# Patient Record
Sex: Male | Born: 1970 | Race: Black or African American | Hispanic: No | Marital: Married | Smoking: Never smoker
Health system: Southern US, Community
[De-identification: ages and names within clinical notes are randomized; demographics above are authoritative.]

## PROBLEM LIST (undated history)

## (undated) HISTORY — PX: HERNIA REPAIR: SHX51

## (undated) HISTORY — PX: KNEE SURGERY: SHX244

---

## 2012-03-26 ENCOUNTER — Emergency Department (HOSPITAL_COMMUNITY)

## 2012-03-26 ENCOUNTER — Encounter (HOSPITAL_COMMUNITY): Payer: Self-pay

## 2012-03-26 ENCOUNTER — Emergency Department (HOSPITAL_COMMUNITY)
Admission: EM | Admit: 2012-03-26 | Discharge: 2012-03-26 | Disposition: A | Discharge status: Home / Self Care | Attending: Emergency Medicine | Admitting: Emergency Medicine

## 2012-03-26 DIAGNOSIS — I959 Hypotension, unspecified: Secondary | ICD-10-CM | POA: Insufficient documentation

## 2012-03-26 DIAGNOSIS — R059 Cough, unspecified: Secondary | ICD-10-CM | POA: Insufficient documentation

## 2012-03-26 DIAGNOSIS — R55 Syncope and collapse: Secondary | ICD-10-CM | POA: Insufficient documentation

## 2012-03-26 DIAGNOSIS — R05 Cough: Secondary | ICD-10-CM | POA: Insufficient documentation

## 2012-03-26 DIAGNOSIS — E860 Dehydration: Secondary | ICD-10-CM

## 2012-03-26 LAB — BASIC METABOLIC PANEL
BUN: 9 mg/dL (ref 6–23)
Chloride: 107 mEq/L (ref 96–112)
Creatinine, Ser: 1.49 mg/dL — ABNORMAL HIGH (ref 0.50–1.35)
GFR calc Af Amer: 66 mL/min — ABNORMAL LOW (ref >90)

## 2012-03-26 LAB — URINALYSIS, ROUTINE W REFLEX MICROSCOPIC
Glucose, UA: NEGATIVE mg/dL
Leukocytes, UA: NEGATIVE
Specific Gravity, Urine: 1.025 (ref 1.005–1.030)
pH: 6 (ref 5.0–8.0)

## 2012-03-26 LAB — D-DIMER, QUANTITATIVE: D-Dimer, Quant: <0.22 ug/mL-FEU (ref 0.00–0.48)

## 2012-03-26 LAB — CBC
HCT: 39.1 % (ref 39.0–52.0)
Hemoglobin: 12.9 g/dL — ABNORMAL LOW (ref 13.0–17.0)
MCH: 28.9 pg (ref 26.0–34.0)
MCHC: 33 g/dL (ref 30.0–36.0)
MCV: 87.5 fL (ref 78.0–100.0)

## 2012-03-26 LAB — DIFFERENTIAL
Basophils Relative: 0 % (ref 0–1)
Monocytes Absolute: 0.4 10*3/uL (ref 0.1–1.0)
Monocytes Relative: 4 % (ref 3–12)
Neutro Abs: 7.5 10*3/uL (ref 1.7–7.7)

## 2012-03-26 LAB — RAPID URINE DRUG SCREEN, HOSP PERFORMED
Amphetamines: NOT DETECTED
Barbiturates: NOT DETECTED
Benzodiazepines: NOT DETECTED

## 2012-03-26 LAB — GLUCOSE, CAPILLARY: Glucose-Capillary: 90 mg/dL (ref 70–99)

## 2012-03-26 MED ORDER — SODIUM CHLORIDE 0.9 % IV BOLUS (SEPSIS)
1000.0000 mL | Freq: Once | INTRAVENOUS | Status: AC
  Administered 2012-03-26: 1000 mL via INTRAVENOUS

## 2012-03-26 MED ORDER — SODIUM CHLORIDE 0.9 % IV SOLN: INTRAVENOUS | Status: DC

## 2012-03-26 NOTE — ED Notes (Signed)
No complaints of being dizzy or unsteady during orthostatic vital signs

## 2012-03-26 NOTE — ED Notes (Signed)
Pt arrived via ems.  Reports worked out this am and felt very hot and dizzy.  EMS reports pt was hypotensive.  EMS administered approx of NSS.  BP 98/46 at this time.  Pt says feels a little better.  Pt has had breakfast.

## 2012-03-26 NOTE — ED Notes (Signed)
Patient eating food at this time, no other needs voiced at this time.

## 2012-03-26 NOTE — ED Notes (Signed)
Patient ambulated to restroom and back without difficulty.

## 2012-03-26 NOTE — Discharge Instructions (Signed)
RESOURCE GUIDE  Dental Problems  Patients with Medicaid: [facility]                     [facility] [address]                                           [address] Phone:  [phone]                                                  Phone:  [phone]  If unable to pay or uninsured, contact:  [facility] or [facility] to become qualified for the adult dental clinic.  Chronic Pain Problems Contact [facility]  [phone] Patients need to be referred by their primary care doctor.  Insufficient Money for Medicine Contact [employer]:  call "211" or [facility] 271-5999.  No Primary Care Doctor Call [facility]  832-8000 Other agencies that provide inexpensive medical care    [facility]  [phone]    [facility]  [phone]    [facility]  [phone]    [facility] Clinic  [phone]    [facility]  [phone]    [facility]  [phone]  Psychological Services [facility]  [phone] [facility]  [phone] [facility]   [phone] (emergency services [phone])  Substance Abuse Resources [facility]  336-882-2125 [facility] 336-784-9470 [facility] 336-285-9073 [facility] 336-845-3988 Residential & Outpatient Substance Abuse Program  800-659-3381  Abuse/Neglect [place] Child Abuse Hotline (336) 641-3795 [place] Child Abuse Hotline 800-378-5315 (After Hours)  Emergency Shelter [facility] (336) 271-5985  Maternity Homes [facility] (336) 275-9566 [facility] (704) 372-4663  MRSA Hotline #:   832-7006    [place] [facility]  [facility] [place]     [employer]                          [facility] [address] [place]                       [address]      [address]  [place]                                                [place]                            [place] Phone:  [phone]                                   Phone:  [phone]                 Phone:  [phone]  [facility] Phone:  [phone]    [facility] Child Abuse Hotline 937-535-6972 910-675-6594 (After Hours)   Take your usual prescriptions as previously directed.  Call your regular medical doctor today to schedule a follow up appointment within the next 2 days.  Return to the Emergency Department immediately sooner if worsening.

## 2012-03-26 NOTE — ED Notes (Signed)
Staff from correctional facility called and requested that a drug screen be done to determine if pt has any "K2" in his system.  Notified Dr. Clarene Duke.  Spoke with Gunnar Fusi in the lab and was told what to order.  Order written on downtime order form and sent to lab.  Was told would be added on to the urine that they already had and the test is a send out.

## 2012-03-26 NOTE — ED Provider Notes (Signed)
History   This chart was scribed for Laray Anger, DO by Brooks Sailors. The patient was seen in room APA06/APA06.  CSN: [id number]  Arrival date & time 03/26/12  1011   First MD Initiated Contact with Patient 03/26/12 1029      Chief Complaint  Patient presents with  . Hypotension     HPI Pt seen at 1045.  Per pt, EMS and Geophysical data processor, pt c/o sudden onset and resolution of one brief episode of syncope that began PTA.  Patient states he finished working out, then felt hot and dizzy, followed by a syncopal episode.  EMS notes pt's BP was "low" on their arrival to the scene, gave IVF bolus en route with improvement.  Pt states he feels "better" now.  Denies CP/palpitations, no SOB/cough, no abd pain, no back pain, no headache, no neck pain, no visual changes, no focal motor weakness, no tingling/numbness in extremities, no N/V/D, no recent head/neck trauma.     History reviewed. No pertinent past medical history.  Past Surgical History  Procedure Date  . Hernia repair   . Knee surgery     History  Substance Use Topics  . Smoking status: Never Smoker   . Smokeless tobacco: Not on file  . Alcohol Use: No    Review of Systems ROS: Statement: All systems negative except as marked or noted in the HPI; Constitutional: Negative for fever and chills. ; ; Eyes: Negative for eye pain, redness and discharge. ; ; ENMT: Negative for ear pain, hoarseness, nasal congestion, sinus pressure and sore throat. ; ; Cardiovascular: Negative for chest pain, palpitations, diaphoresis, dyspnea and peripheral edema. ; ; Respiratory: Negative for cough, wheezing and stridor. ; ; Gastrointestinal: Negative for nausea, vomiting, diarrhea, abdominal pain, blood in stool, hematemesis, jaundice and rectal bleeding. . ; ; Genitourinary: Negative for dysuria, flank pain and hematuria. ; ; Musculoskeletal: Negative for back pain and neck pain. Negative for swelling and trauma.; ; Skin: Negative for  pruritus, rash, abrasions, blisters, bruising and skin lesion.; ; Neuro: Negative for headache, lightheadedness and neck stiffness. Negative for weakness, altered level of consciousness , altered mental status, extremity weakness, paresthesias, involuntary movement, seizure and +syncope.      Allergies  Review of patient's allergies indicates no known allergies.  Home Medications  No current outpatient prescriptions on file.  BP 101/63  Pulse 70  Temp(Src) 97.9 F (36.6 C) (Oral)  Resp 16  Ht 5\' 9"  (1.753 m)  Wt 175 lb (79.379 kg)  BMI 25.84 kg/m2  SpO2 100%  Physical Exam 1050: Physical examination:  Nursing notes reviewed; Vital signs and O2 SAT reviewed;  Constitutional: Well developed, Well nourished, In no acute distress; Head:  Normocephalic, atraumatic; Eyes: EOMI, PERRL, No scleral icterus; ENMT: Mouth and pharynx normal, Mucous membranes dry; Neck: Supple, Full range of motion, No lymphadenopathy; Cardiovascular: Regular rate and rhythm, No murmur or gallop; Respiratory: Breath sounds clear & equal bilaterally, No rales, rhonchi, wheezes, Normal respiratory effort/excursion; Chest: Nontender, Movement normal; Abdomen: Soft, Nontender, Nondistended, Normal bowel sounds;  Extremities: Pulses normal, No tenderness, No edema, No calf edema or asymmetry.; Neuro: AA&Ox3, Major CN grossly intact. Speech clear, no facial droop. No gross focal motor or sensory deficits in extremities.; Skin: Color normal, Warm, Dry, no rash.    ED Course  Procedures    MDM  MDM Reviewed: nursing note and vitals Interpretation: ECG, labs, x-ray and CT scan      Date: 03/26/2012  Rate: 54  Rhythm: sinus bradycardia  QRS Axis: normal  Intervals: normal  ST/T Wave abnormalities: normal  Conduction Disutrbances:none  Narrative Interpretation:   Old EKG Reviewed: none available.  Results for orders placed during the hospital encounter of 03/26/12  GLUCOSE, CAPILLARY      Component Value  Range   Glucose-Capillary 90  70 - 99 (mg/dL)  CBC      Component Value Range   WBC 9.1  4.0 - 10.5 (K/uL)   RBC 4.47  4.22 - 5.81 (MIL/uL)   Hemoglobin 12.9 (*) 13.0 - 17.0 (g/dL)   HCT 78.2  95.6 - 21.3 (%)   MCV 87.5  78.0 - 100.0 (fL)   MCH 28.9  26.0 - 34.0 (pg)   MCHC 33.0  30.0 - 36.0 (g/dL)   RDW 08.6  57.8 - 46.9 (%)   Platelets 198  150 - 400 (K/uL)  DIFFERENTIAL      Component Value Range   Neutrophils Relative 82 (*) 43 - 77 (%)   Neutro Abs 7.5  1.7 - 7.7 (K/uL)   Lymphocytes Relative 12  12 - 46 (%)   Lymphs Abs 1.1  0.7 - 4.0 (K/uL)   Monocytes Relative 4  3 - 12 (%)   Monocytes Absolute 0.4  0.1 - 1.0 (K/uL)   Eosinophils Relative 1  0 - 5 (%)   Eosinophils Absolute 0.1  0.0 - 0.7 (K/uL)   Basophils Relative 0  0 - 1 (%)   Basophils Absolute 0.0  0.0 - 0.1 (K/uL)  BASIC METABOLIC PANEL      Component Value Range   Sodium 140  135 - 145 (mEq/L)   Potassium 4.5  3.5 - 5.1 (mEq/L)   Chloride 107  96 - 112 (mEq/L)   CO2 27  19 - 32 (mEq/L)   Glucose, Bld 88  70 - 99 (mg/dL)   BUN 9  6 - 23 (mg/dL)   Creatinine, Ser 6.29 (*) 0.50 - 1.35 (mg/dL)   Calcium 9.5  8.4 - 52.8 (mg/dL)   GFR calc non Af Amer 57 (*) >90 (mL/min)   GFR calc Af Amer 66 (*) >90 (mL/min)  TROPONIN I      Component Value Range   Troponin I <0.30  <0.30 (ng/mL)  URINE RAPID DRUG SCREEN (HOSP PERFORMED)      Component Value Range   Opiates NONE DETECTED  NONE DETECTED    Cocaine NONE DETECTED  NONE DETECTED    Benzodiazepines NONE DETECTED  NONE DETECTED    Amphetamines NONE DETECTED  NONE DETECTED    Tetrahydrocannabinol POSITIVE (*) NONE DETECTED    Barbiturates NONE DETECTED  NONE DETECTED   URINALYSIS, ROUTINE W REFLEX MICROSCOPIC      Component Value Range   Color, Urine YELLOW  YELLOW    APPearance CLEAR  CLEAR    Specific Gravity, Urine 1.025  1.005 - 1.030    pH 6.0  5.0 - 8.0    Glucose, UA NEGATIVE  NEGATIVE (mg/dL)   Hgb urine dipstick NEGATIVE  NEGATIVE    Bilirubin  Urine NEGATIVE  NEGATIVE    Ketones, ur NEGATIVE  NEGATIVE (mg/dL)   Protein, ur NEGATIVE  NEGATIVE (mg/dL)   Urobilinogen, UA 0.2  0.0 - 1.0 (mg/dL)   Nitrite NEGATIVE  NEGATIVE    Leukocytes, UA NEGATIVE  NEGATIVE   D-DIMER, QUANTITATIVE      Component Value Range   D-Dimer, Quant <0.22  0.00 - 0.48 (ug/mL-FEU)   Dg Chest 2 View 03/26/2012  *  RADIOLOGY REPORT*  Clinical Data: Cough  CHEST - 2 VIEW  Comparison: None  Findings: Upper-normal size of cardiac silhouette. Mediastinal contours and pulmonary vascularity normal. Lungs appear mildly hyperaerated but clear. No pleural effusion or pneumothorax. Question left nipple shadow versus nodular density lower left chest. No acute osseous findings.  IMPRESSION: Mildly hyperaerated lungs without acute infiltrate. Question left nipple shadow versus nodular density; recommend repeat PA chest radiograph with nipple markers to exclude pulmonary nodule.  Original Report Authenticated By: Lollie Marrow, M.D.   Ct Head Wo Contrast 03/26/2012  *RADIOLOGY REPORT*  Clinical Data: Syncope  CT HEAD WITHOUT CONTRAST  Technique:  Contiguous axial images were obtained from the base of the skull through the vertex without contrast.  Comparison: None  Findings: Normal ventricular morphology. No midline shift or mass effect. Normal appearance of brain parenchyma. No intracranial hemorrhage, mass lesion, or acute infarction. Visualized paranasal sinuses and mastoid air cells clear. Bones unremarkable.  IMPRESSION: No acute intracranial abnormalities.  Original Report Authenticated By: Lollie Marrow, M.D.   Dg Chest Special View 03/26/2012  *RADIOLOGY REPORT*  Clinical Data: Abnormal chest x-ray question nipple shadow versus nodule  CHEST SPECIAL VIEW  Comparison: Repeat PA chest radiograph with nipple markers compared to earlier study of 03/26/2012  Findings: Left nipple marker corresponds to nodular density seen on previous exam compatible with nipple shadow. No pulmonary  nodule identified. Lungs remain clear. Stable heart size.  IMPRESSION: Nipple shadow on previous exam; no evidence of pulmonary nodule.  Original Report Authenticated By: Lollie Marrow, M.D.     3:10 PM:  VS remain stable after IVF and several hours of observation in the ED.  No change in neuro status.  Pt has walked around the ED with steady gait, easy resps, denies lightheadedness/near syncope, denies CP/SOB.  Has tol PO meal in ED well without N/V.  Doubt ACS or PE as cause for syncope.  Jail staff are requesting "a lab test to check for K2," as they are concerned that "pt has been smoking K2."  ED RN to discuss with lab on how to order.  Dx testing d/w pt and Jail staff.  Questions answered.  Verb understanding, agreeable to d/c home with outpt f/u.          Laray Anger, DO 03/28/12 1842

## 2012-03-29 LAB — URINE CULTURE: Colony Count: >=100000

## 2012-04-04 NOTE — ED Notes (Signed)
Copy of urine faxed to [phone]

## 2012-04-12 LAB — MISCELLANEOUS TEST

## 2013-12-30 IMAGING — CT CT HEAD W/O CM
1 series · 16 of 30 positions shown, 20 images · non-contrast
Comparison: None

CLINICAL DATA: Syncope

CT HEAD WITHOUT CONTRAST
TECHNIQUE: Contiguous axial images were obtained from the base of
the skull through the vertex without contrast.

[Series 2: headseq 4.8 h37s · axial · 0.43mm/px · z∈[+213,+366]mm · 16 of 36 slices shown, 20 images]
[im 2/36  brain]
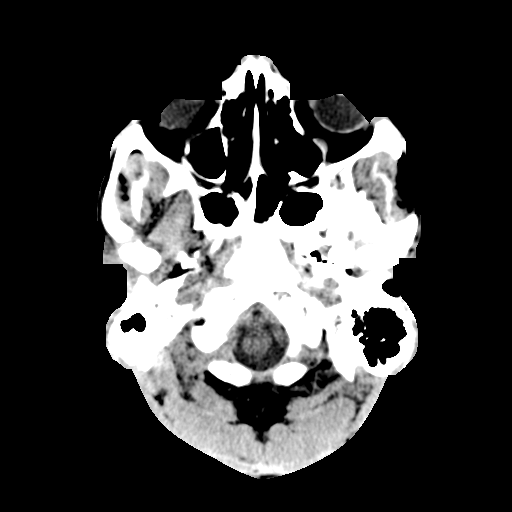
[im 2/36  bone]
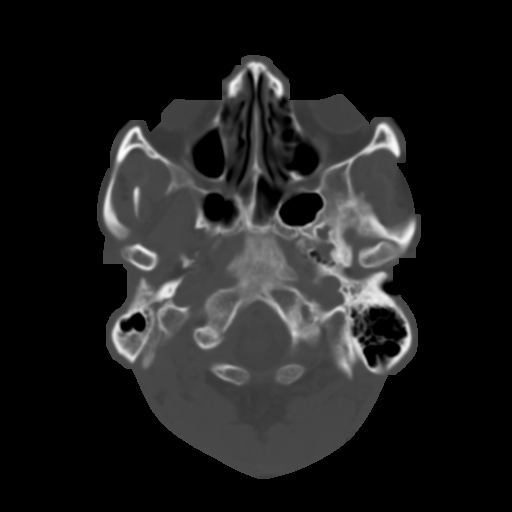
[im 4/36  brain]
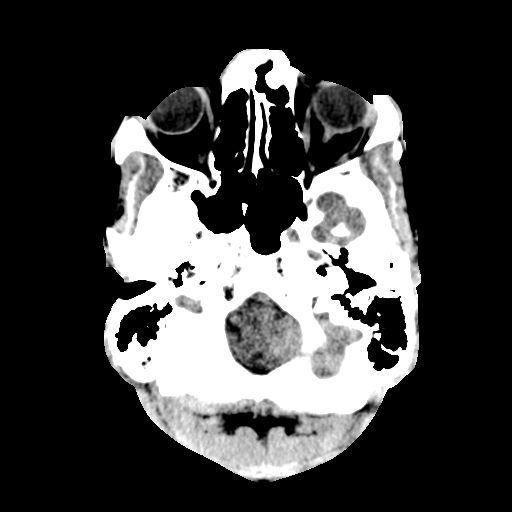
[im 7/36  brain]
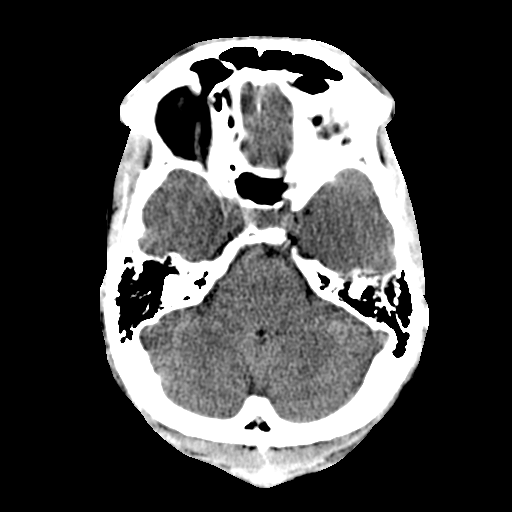
[im 9/36  brain]
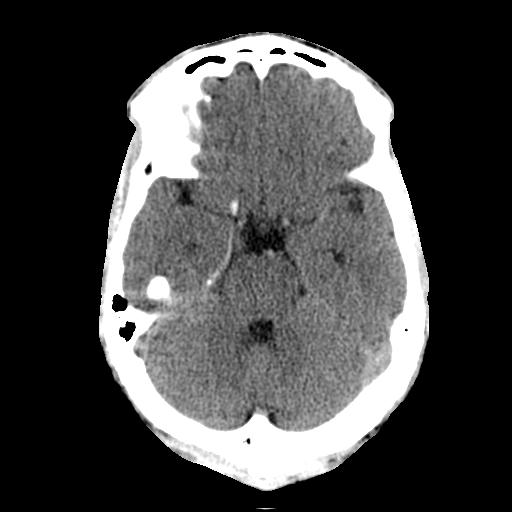
[im 10/36  brain]
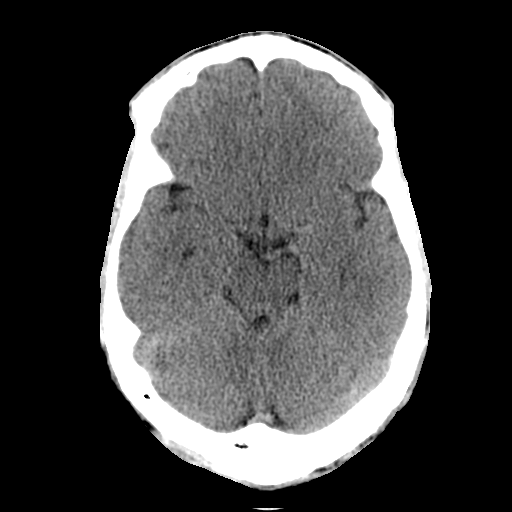
[im 10/36  bone]
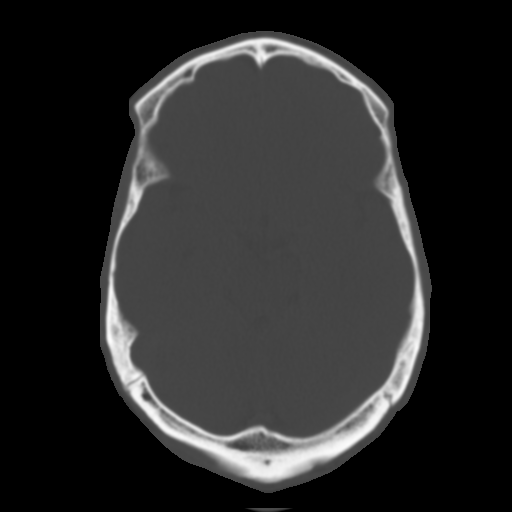
[im 13/36  brain]
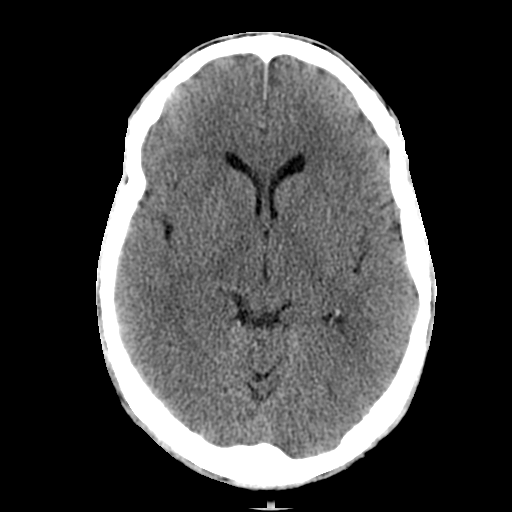
[im 15/36  brain]
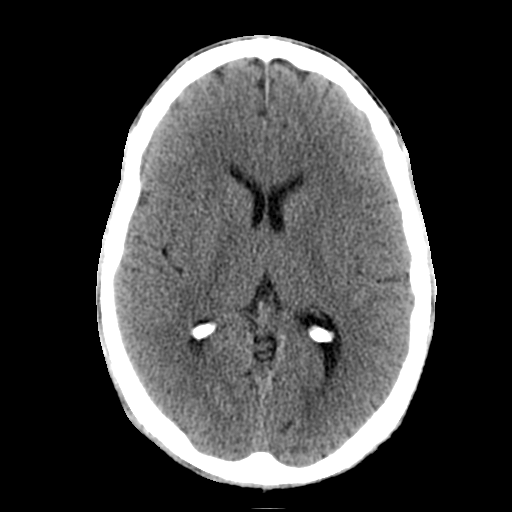
[im 17/36  brain]
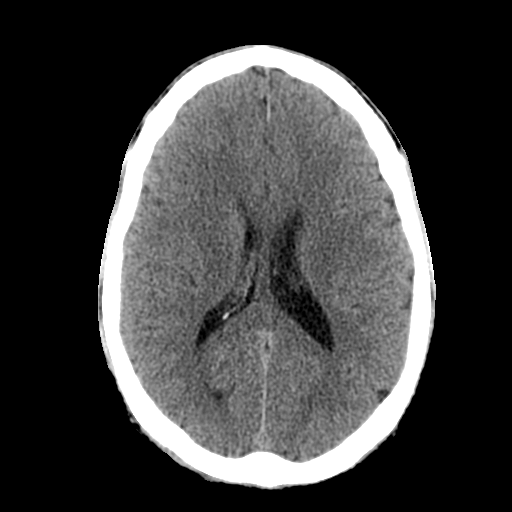
[im 19/36  brain]
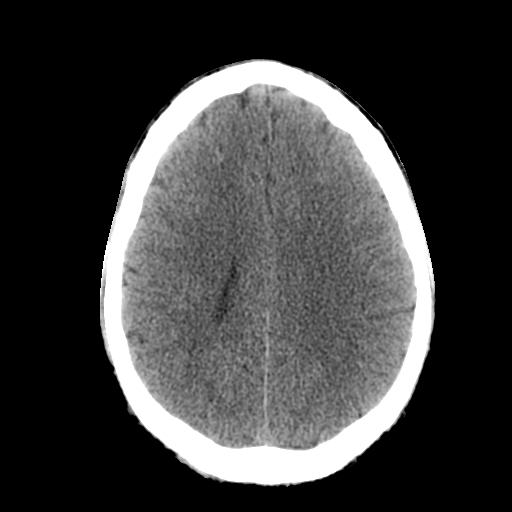
[im 19/36  bone]
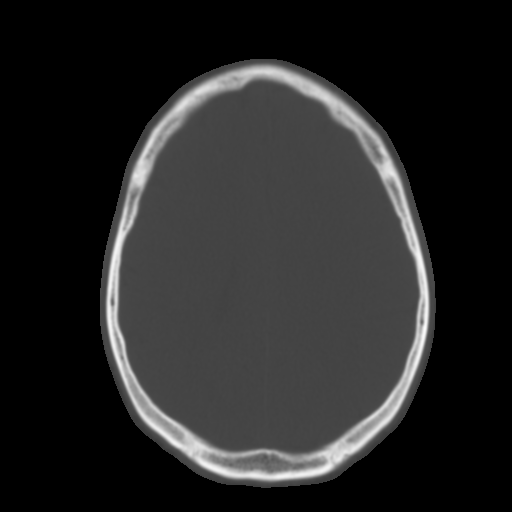
[im 21/36  brain]
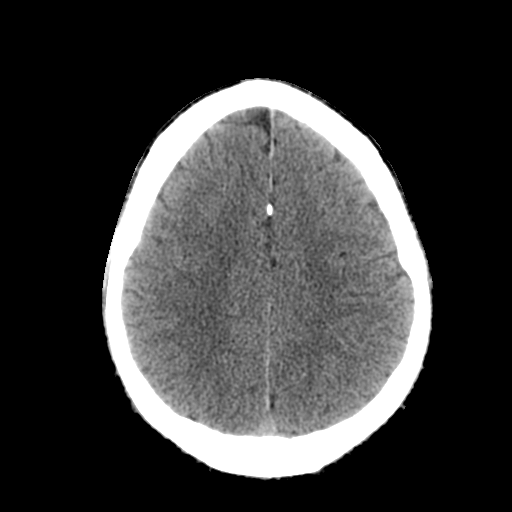
[im 23/36  brain]
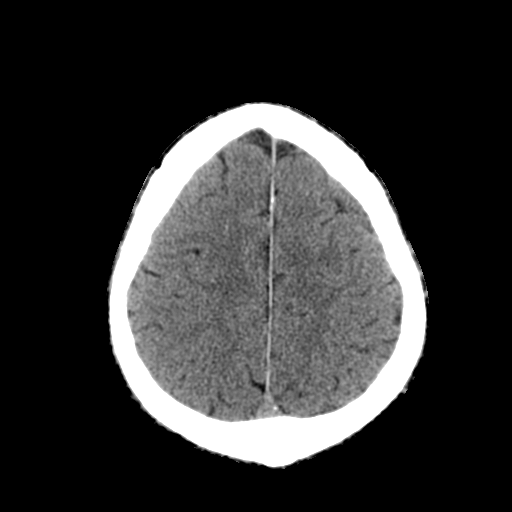
[im 26/36  brain]
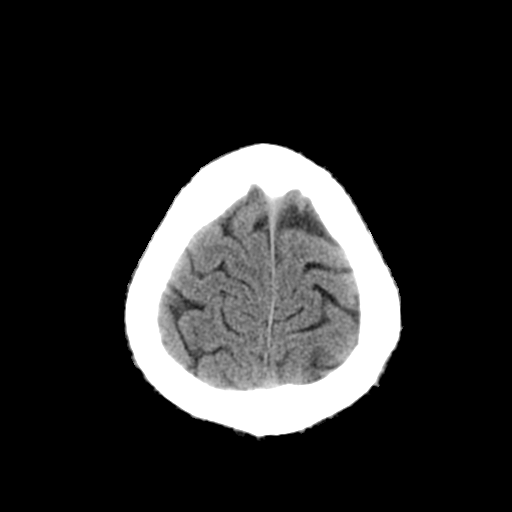
[im 27/36  brain]
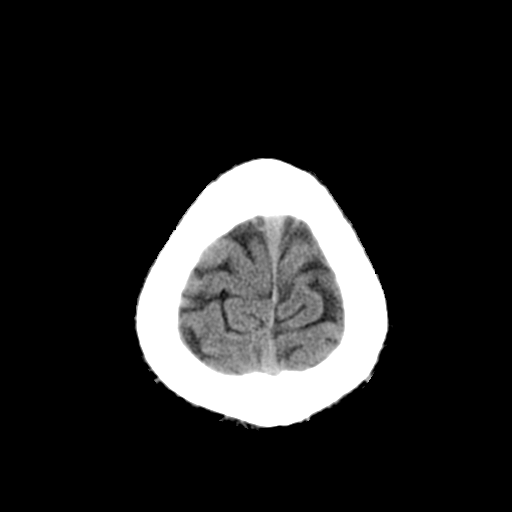
[im 27/36  bone]
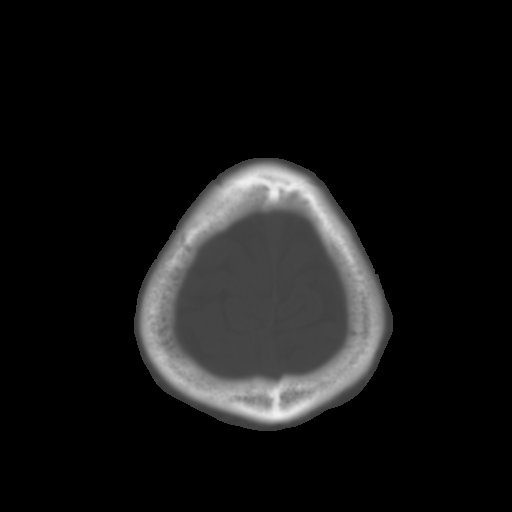
[im 29/36  brain]
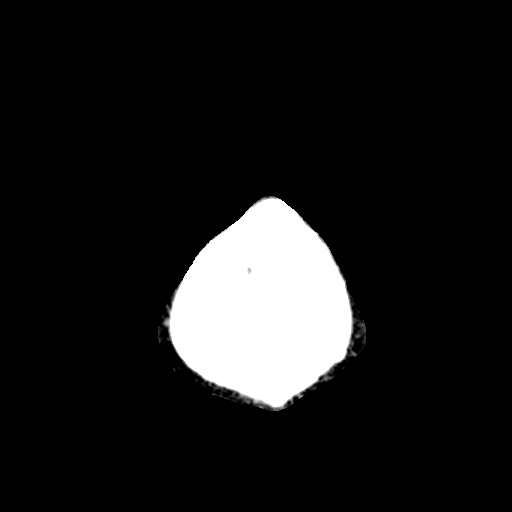
[im 32/36  brain]
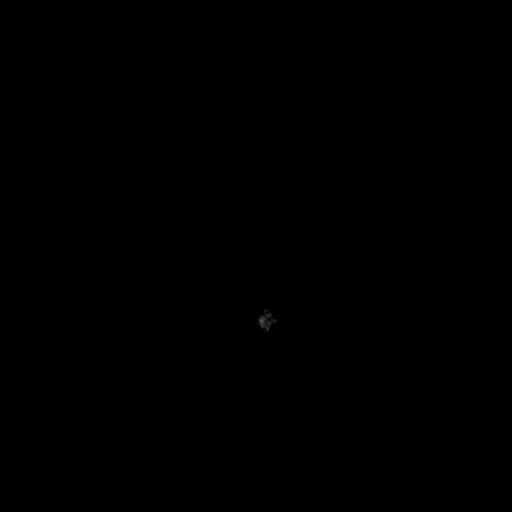
[im 34/36  brain]
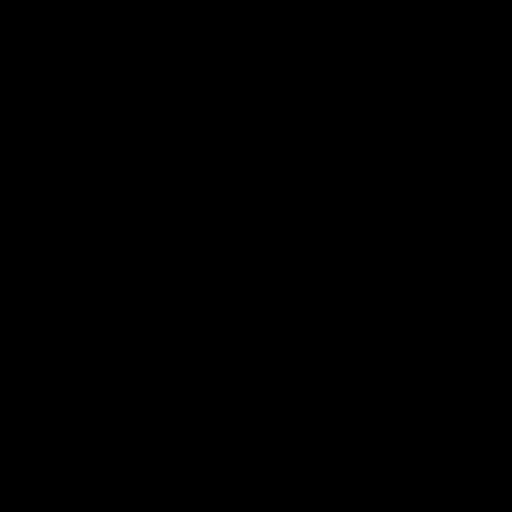

[16 of 30 positions shown; findings below may reference images not displayed]

FINDINGS: Normal ventricular morphology.
No midline shift or mass effect.
Normal appearance of brain parenchyma.
No intracranial hemorrhage, mass lesion, or acute infarction.
Visualized paranasal sinuses and mastoid air cells clear.
Bones unremarkable.
IMPRESSION: No acute intracranial abnormalities.

## 2013-12-30 IMAGING — CR DG CHEST 2V
2 series · 2 of 2 positions shown · non-contrast
Comparison: None

CLINICAL DATA: Cough

CHEST - 2 VIEW

[view not recorded (1 of 2)]
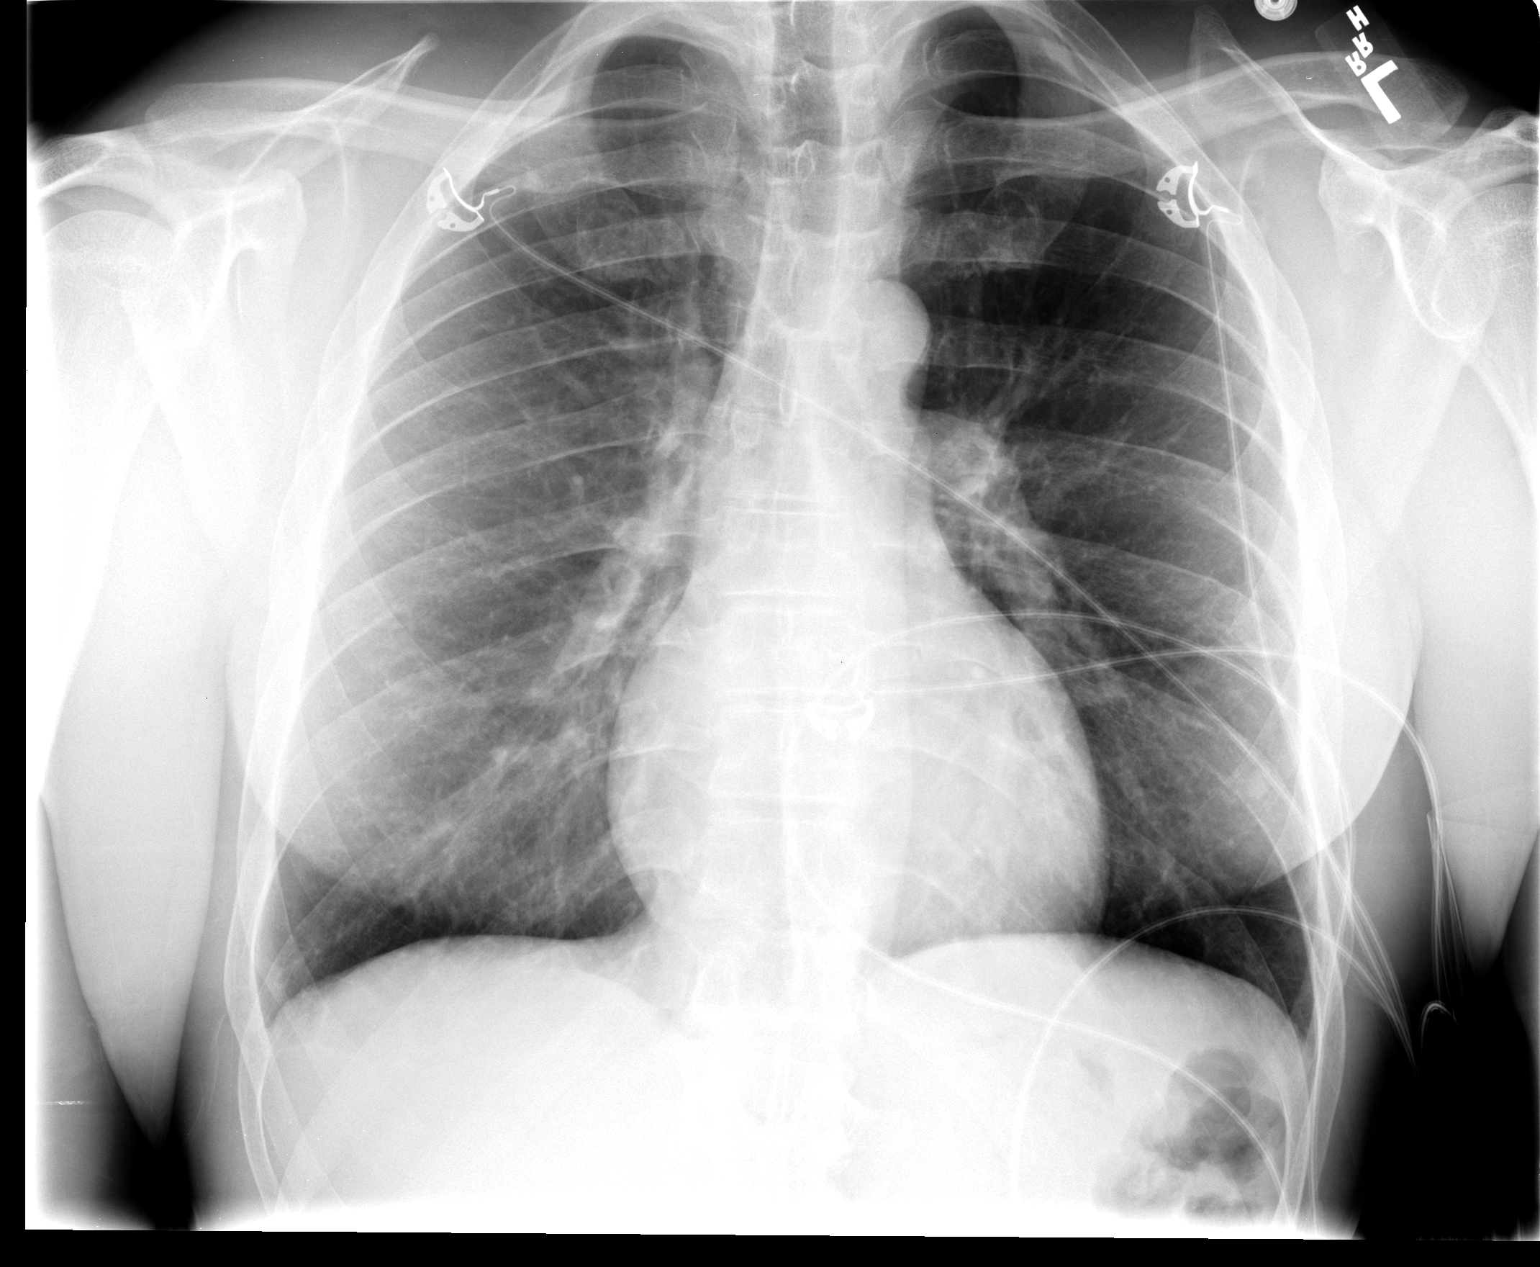

[view not recorded (2 of 2)]
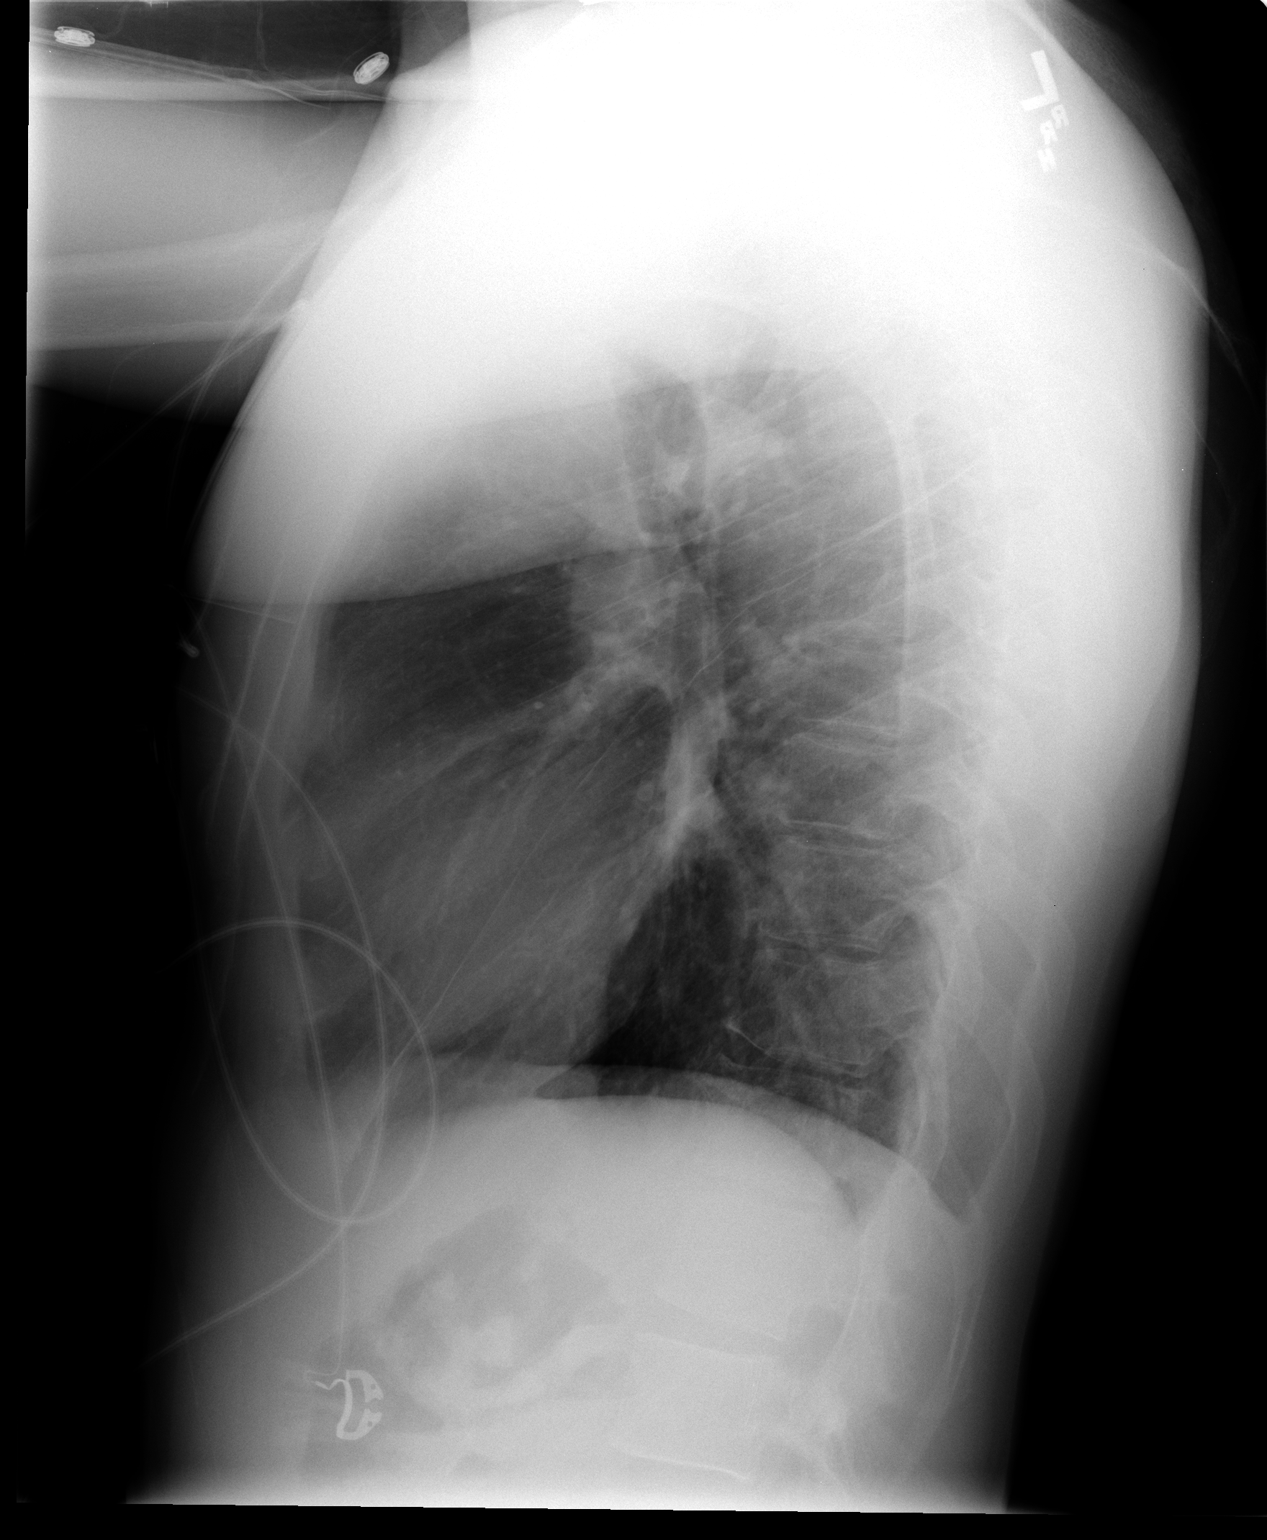

[2 of 2 positions shown; findings below may reference images not displayed]

FINDINGS: Upper-normal size of cardiac silhouette.
Mediastinal contours and pulmonary vascularity normal.
Lungs appear mildly hyperaerated but clear.
No pleural effusion or pneumothorax.
Question left nipple shadow versus nodular density lower left
chest.
No acute osseous findings.
IMPRESSION: Mildly hyperaerated lungs without acute infiltrate.
Question left nipple shadow versus nodular density; recommend
repeat PA chest radiograph with nipple markers to exclude pulmonary
nodule.

## 2013-12-30 IMAGING — CR DG CHEST SPECIAL VIEW
1 series · 1 of 1 positions shown · non-contrast
Comparison: Repeat PA chest radiograph with nipple markers compared
to earlier study of 03/26/2012

CLINICAL DATA: Abnormal chest x-ray question nipple shadow versus
nodule

CHEST SPECIAL VIEW

[view not recorded]
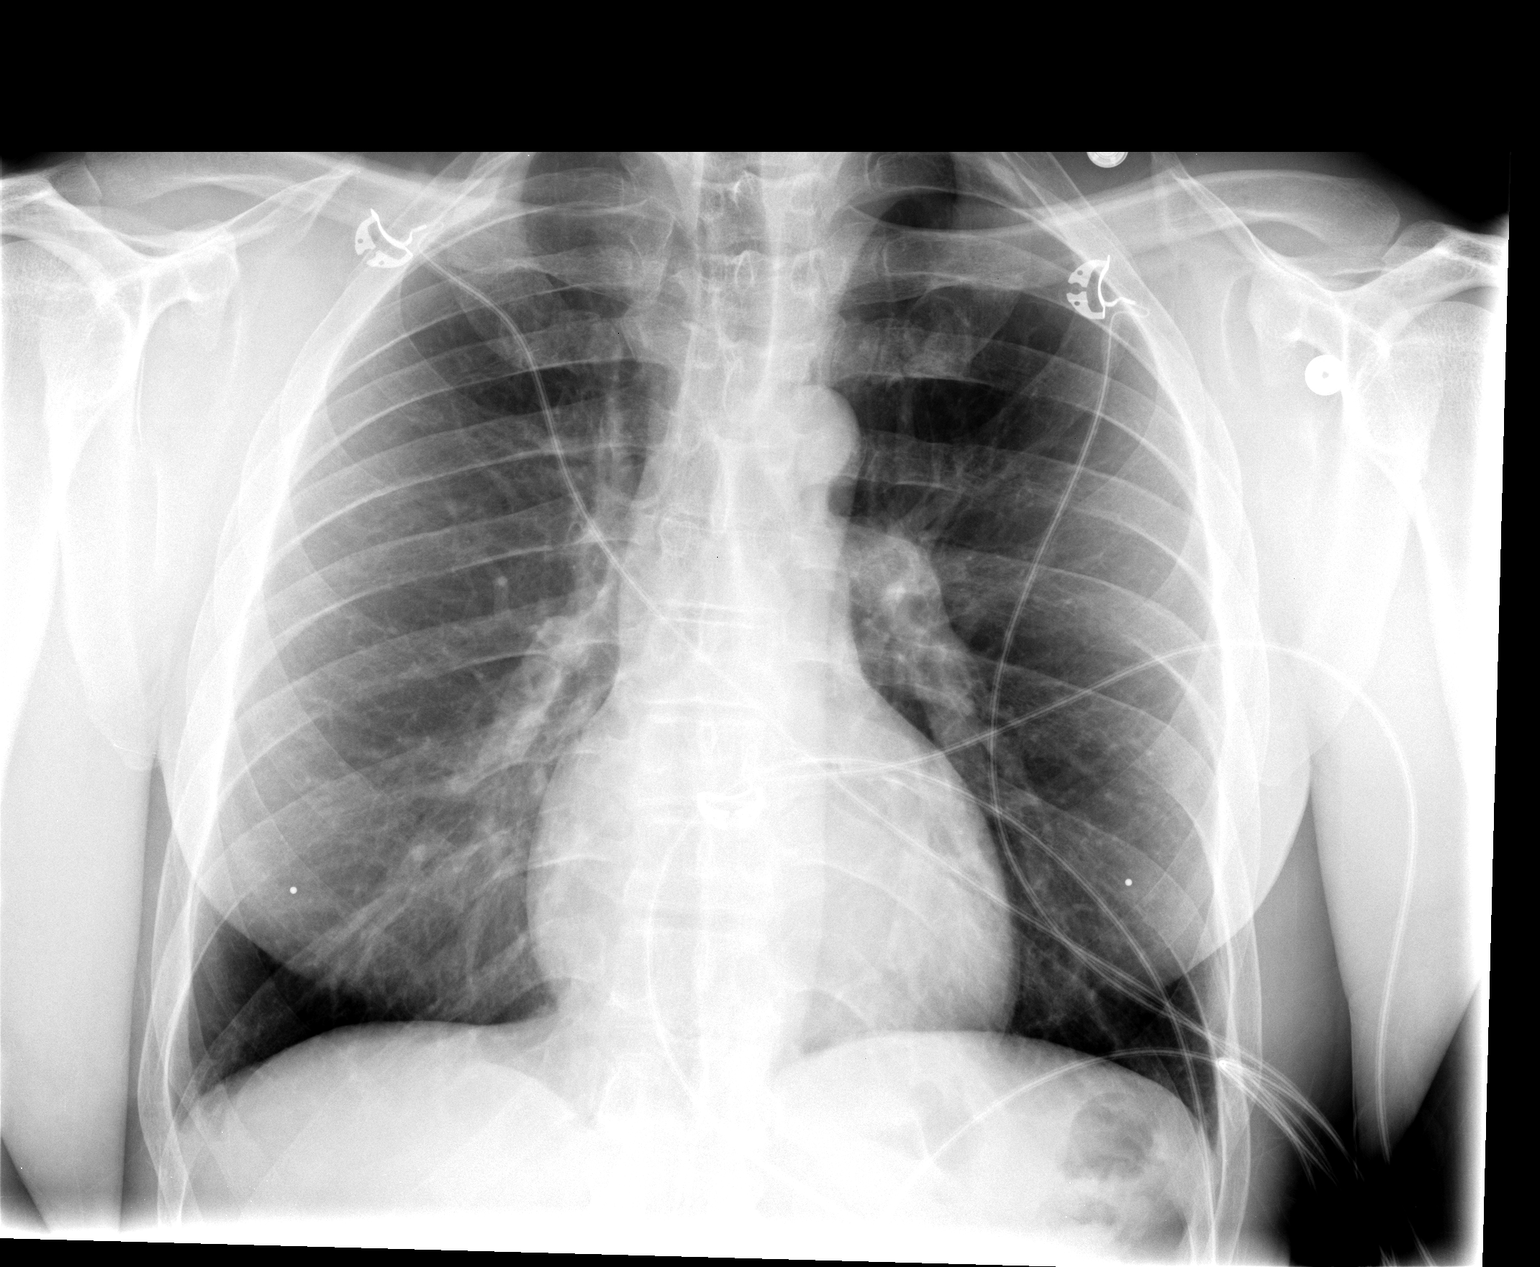

[1 of 1 positions shown; findings below may reference images not displayed]

FINDINGS: Left nipple marker corresponds to nodular density seen on previous
exam compatible with nipple shadow.
No pulmonary nodule identified.
Lungs remain clear.
Stable heart size.
IMPRESSION: Nipple shadow on previous exam; no evidence of pulmonary nodule.

## 2021-07-25 ENCOUNTER — Emergency Department (HOSPITAL_COMMUNITY)
Admission: EM | Admit: 2021-07-25 | Discharge: 2021-07-25 | Disposition: A | Discharge status: Home / Self Care | Attending: Emergency Medicine | Admitting: Emergency Medicine

## 2021-07-25 ENCOUNTER — Other Ambulatory Visit: Payer: Self-pay

## 2021-07-25 ENCOUNTER — Encounter (HOSPITAL_COMMUNITY): Payer: Self-pay

## 2021-07-25 DIAGNOSIS — W57XXXA Bitten or stung by nonvenomous insect and other nonvenomous arthropods, initial encounter: Secondary | ICD-10-CM | POA: Insufficient documentation

## 2021-07-25 DIAGNOSIS — S60462A Insect bite (nonvenomous) of right middle finger, initial encounter: Secondary | ICD-10-CM | POA: Diagnosis present

## 2021-07-25 NOTE — ED Provider Notes (Signed)
[facility]-EMERGENCY DEPT Provider Note   CSN: [id number] Arrival date & time: 07/25/21  1156     History Chief Complaint  Patient presents with   Insect Bite    Javier Crane is a 50 y.o. male who presents the emergency department today after being stung by a hornet to the right third finger at 10 AM this morning.  Patient reports associated swelling to the finger and "skin tightness."  He denies any fever, chills, throat swelling, chest pain, shortness of breath, abdominal pain, nausea, vomiting, diarrhea.  He was given Benadryl prior to arrival by EMS.  The history is provided by the patient. No language interpreter was used.      History reviewed. No pertinent past medical history.  There are no problems to display for this patient.   Past Surgical History:  Procedure Laterality Date   HERNIA REPAIR     KNEE SURGERY         History reviewed. No pertinent family history.  Social History   Tobacco Use   Smoking status: Never  Substance Use Topics   Alcohol use: No   Drug use: No    Home Medications Prior to Admission medications   Not on File    Allergies    Patient has no known allergies.  Review of Systems   Review of Systems  All other systems reviewed and are negative.  Physical Exam Updated Vital Signs BP 129/86 (BP Location: Left Arm)   Pulse 61   Temp 98.1 F (36.7 C) (Oral)   Resp 18   SpO2 95%   Physical Exam Vitals reviewed.  Constitutional:      Appearance: Normal appearance.  HENT:     Head: Normocephalic and atraumatic.  Eyes:     General:        Right eye: No discharge.        Left eye: No discharge.     Conjunctiva/sclera: Conjunctivae normal.  Pulmonary:     Effort: Pulmonary effort is normal.  Musculoskeletal:     Comments: Right hand is not swollen or erythematous.  He has full range of motion to all fingers.  Skin:    General: Skin is warm and dry.     Findings: No rash.     Comments: There is  mild swelling to the right third finger.   Neurological:     General: No focal deficit present.     Mental Status: He is alert.  Psychiatric:        Mood and Affect: Mood normal.        Behavior: Behavior normal.    ED Results / Procedures / Treatments   Labs (all labs ordered are listed, but only abnormal results are displayed) Labs Reviewed - No data to display  EKG None  Radiology No results found.  Procedures Procedures   Medications Ordered in ED Medications - No data to display  ED Course  I have reviewed the triage vital signs and the nursing notes.  Pertinent labs & imaging results that were available during my care of the patient were reviewed by me and considered in my medical decision making (see chart for details).  Clinical Course as of 07/25/21 1356  Sun Jul 25, 2021  1350 Discussed case with attending. He evaluated patient at bedside. He agrees with plan. [CF]    Clinical Course User Index [CF] Jolyn Lent   MDM Rules/Calculators/A&P  Javier Crane is a 50 y.o. male who presents to the emergency department for further evaluation of a bug bite.  Clinically, he appears stable.  He has been normotensive and not tachycardic during his stay.  He is afebrile.  No clinical signs of anaphylaxis at this time. I have a low suspicion for overlying infection. Will have him ice the area and take Tylenol/ibuprofen for pain and swelling. He is hemodynamically stable and safe for discharge.   Final Clinical Impression(s) / ED Diagnoses Final diagnoses:  Insect bite of right middle finger, initial encounter    Rx / DC Orders ED Discharge Orders     None        Teressa Lower, [place] 07/25/21 1356    Glendora Score, MD 07/25/21 2333

## 2021-07-25 NOTE — ED Triage Notes (Signed)
Per EMS, from [facility], bee sting to right hand. 50mg  benadryl PO with EMS.  BP 114/64 90 HR 98%

## 2021-07-25 NOTE — Discharge Instructions (Addendum)
You are seen and evaluated in the emergency department today for an insect bite.  As we discussed, there does not appear to be any overlying signs of infection at this time.  Please return to the emergency department for worsening pain/swelling, trouble breathing, trouble swallowing, new nausea/vomiting, new rash, or any other concerns you might have.
